# Patient Record
Sex: Female | Born: 2000 | Race: Black or African American | Hispanic: No | Marital: Single | Smoking: Never smoker
Health system: Southern US, Community
[De-identification: ages and names within clinical notes are randomized; demographics above are authoritative.]

---
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
## 2021-03-22 ENCOUNTER — Ambulatory Visit (HOSPITAL_COMMUNITY)
Admission: EM | Admit: 2021-03-22 | Discharge: 2021-03-22 | Disposition: A | Discharge status: Home / Self Care | Payer: BC Managed Care – PPO

## 2021-03-22 DIAGNOSIS — F331 Major depressive disorder, recurrent, moderate: Secondary | ICD-10-CM

## 2021-03-22 NOTE — Discharge Instructions (Addendum)
  Discharge recommendations:  Patient is to take medications as prescribed. Please see information for follow-up appointment with psychiatry and therapy. Please follow up with your primary care provider for all medical related needs.   Therapy: We recommend that patient participate in individual therapy to address mental health concerns.   Safety:  The patient should abstain from use of illicit substances/drugs and abuse of any medications. If symptoms worsen or do not continue to improve or if the patient becomes actively suicidal or homicidal then it is recommended that the patient return to the closest hospital emergency department, the [facility], or call 911 for further evaluation and treatment. National Suicide Prevention Lifeline 1-800-SUICIDE or 1-800-273-8255.  About 988 988 offers 24/7 access to trained crisis counselors who can help people experiencing mental health-related distress. People can call or text 988 or chat 988lifeline.org for themselves or if they are worried about a loved one who may need crisis support.  

## 2021-03-22 NOTE — Progress Notes (Signed)
°   03/22/21 1954  [facility] Triage Screening (Walk-ins at [facility] only)  How Did You Hear About [place]? Family/Friend  What Is the Reason for Your Visit/Call Today? Pt presented voluntarily accompanied by her mother. Pt stated that she has some suicdial ideation about 2 weeks ago without plan. She reported in the past she attempted to complete suicide with a sharp object when she was in the 9th/10th grade. She reported she has been  "dealing with mental illness" since was about 20 years old. Recently her stressors of "bad anxiety" and feeling overwhelmed in her sophomore year of college at [employer]. She reports feeling that she had ADHD due to her struggles at completing small tasks and falling behind on assignments. She also reports random moments of laughing or crying and she does not know why. Patient denies HI and AVH. Reports no alchol use, but reports that she smokes THC, one blunt or joint, every day. Pt has no current OP providers.  How Long Has This Been Causing You Problems? 1-6 months  Have You Recently Had Any Thoughts About Hurting Yourself? Yes  How long ago did you have thoughts about hurting yourself? Pt reports about "2 weeks ago"  Are You Planning to Commit Suicide/Harm Yourself At This time? No  Have you Recently Had Thoughts About Hurting Someone Karolee Ohs? No  Are You Planning To Harm Someone At This Time? No  Are you currently experiencing any auditory, visual or other hallucinations? No  Have You Used Any Alcohol or Drugs in the Past 24 Hours? Yes  How long ago did you use Drugs or Alcohol? Pt reports at some point today  What Did You Use and How Much? Pt reports use of THC everyday  Do you have any current medical co-morbidities that require immediate attention? No  Clinician description of patient physical appearance/behavior: Pt was casually dressed and adequately groomed. Pt was was polite and cooperative. Pt's speech, movement and thought content were within normal limits. Pt's mood was normal,  mood congruent with affect. Pt was oriented x 4.  What Do You Feel Would Help You the Most Today? Treatment for Depression or other mood problem;Stress Management  If access to [facility] was not available, would you have sought care in the Emergency Department? Yes  Determination of Need Urgent (48 hours)  Options For Referral Outpatient Therapy;Medication Management

## 2021-03-22 NOTE — ED Provider Notes (Signed)
Behavioral Health Urgent Care Medical Screening Exam  Patient Name: Brenda Cummings MRN: [id number] Date of Evaluation: 03/22/21 Chief Complaint:   Diagnosis:  Final diagnoses:  MDD (major depressive disorder), recurrent episode, moderate (HCC)    History of Present illness: Brenda Cummings is a 20 y.o. female with a history of depression who presents to [facility] voluntarily due to worsening depression. Patient endorses depressive symptoms, including low mood, insomnia, problems with energy, problems with concentration.   Patient denies loss of interest in pleasurable activities, tearfulness, and appetite changes. Patient denies suicidal and homicidal ideation. She reports that she had suicidal ideations approximately 2-3 weeks ago without plan/intent. 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 She reports difficult keeping up with assignments. Patient states that she lives in student housing. She requests a letter for an emotional support animal. Informed that we would not be able to provide a letter for an ESA. Reports daily use of marijuana. Denies use of alcohol and other substances.   Reviewed TTS assessment and validated with patient. On evaluation patient is alert and oriented x 4, pleasant, and cooperative. Speech is clear and coherent. Mood is depressed and affect is congruent with mood. Thought process is coherent and thought content is logical. Denies auditory and visual hallucinations. No indication that patient is responding to internal stimuli. No evidence of delusional thought content. Denies suicidal ideations. Denies homicidal ideations. Reports that she feels safe returning home.   Psychiatric Specialty Exam  Presentation  General Appearance:Appropriate for Environment; Well Groomed  Eye Contact:Good  Speech:Clear and Coherent; Normal Rate  Speech Volume:Normal  Handedness:No data recorded  Mood and Affect   Mood:Depressed; Anxious  Affect:Congruent   Thought Process  Thought Processes:Coherent; Goal Directed; Linear  Descriptions of Associations:Intact  Orientation:Full (Time, Place and Person)  Thought Content:WDL    Hallucinations:None  Ideas of Reference:None  Suicidal Thoughts:No  Homicidal Thoughts:No   Sensorium  Memory:Immediate Good; Recent Good; Remote Good  Judgment:Fair  Insight:Fair   Executive Functions  Concentration:Good  Attention Span:Good  Recall:Good  Fund of Knowledge:Good  Language:Good   Psychomotor Activity  Psychomotor Activity:Normal   Assets  Assets:Communication Skills; Desire for Improvement; Financial Resources/Insurance; Housing; Physical Health   Sleep  Sleep:Fair  Number of hours: No data recorded  Nutritional Assessment (For OBS and FBC admissions only) Has the patient had a weight loss or gain of 10 pounds or more in the last 3 months?: No Has the patient had a decrease in food intake/or appetite?: No Does the patient have dental problems?: No Does the patient have eating habits or behaviors that may be indicators of an eating disorder including binging or inducing vomiting?: No Has the patient recently lost weight without trying?: 0 Has the patient been eating poorly because of a decreased appetite?: 0 Malnutrition Screening Tool Score: 0   Physical Exam: Physical Exam Constitutional:      General: She is not in acute distress.    Appearance: She is not ill-appearing, toxic-appearing or diaphoretic.  HENT:     Head: Normocephalic.     Right Ear: External ear normal.     Left Ear: External ear normal.  Eyes:     Conjunctiva/sclera: Conjunctivae normal.     Pupils: Pupils are equal, round, and reactive to light.  Cardiovascular:     Rate and Rhythm: Normal rate.  Pulmonary:     Effort: Pulmonary effort is normal. No respiratory distress.  Musculoskeletal:  General: Normal range of motion.  Skin:     General: Skin is warm and dry.  Neurological:     Mental Status: She is alert and oriented to person, place, and time.  Psychiatric:        Mood and Affect: Mood is anxious and depressed.        Thought Content: Thought content is not paranoid or delusional. Thought content does not include homicidal or suicidal ideation.   Review of Systems  Constitutional:  Negative for chills, diaphoresis, fever, malaise/fatigue and weight loss.  HENT:  Negative for congestion.   Respiratory:  Negative for cough and shortness of breath.   Cardiovascular:  Negative for chest pain and palpitations.  Gastrointestinal:  Negative for diarrhea, nausea and vomiting.  Neurological:  Negative for dizziness and seizures.  Psychiatric/Behavioral:  Positive for depression. Negative for hallucinations, memory loss and suicidal ideas. The patient is nervous/anxious and has insomnia.   All other systems reviewed and are negative.  Blood pressure (!) 89/72, pulse 78, temperature 98.4 F (36.9 C), temperature source Oral, resp. rate 16, SpO2 100 %. There is no height or weight on file to calculate BMI.  Musculoskeletal: Strength & Muscle Tone: within normal limits Gait & Station: normal Patient leans: N/A   [facility] MSE Discharge Disposition for Follow up and Recommendations: Based on my evaluation the patient does not appear to have an emergency medical condition and can be discharged with resources and follow up care in outpatient services for Medication Management and Individual Therapy   Discussed services provided at the [facility]. Patient reports that she has a therapist at [facility] and will call to schedule an appointment.    Jackelyn Poling, NP 03/22/2021, 10:06 PM

## 2021-04-01 ENCOUNTER — Telehealth (HOSPITAL_COMMUNITY): Payer: Self-pay | Admitting: Family Medicine

## 2021-04-01 NOTE — BH Assessment (Signed)
Care Management - Follow Up [facility] Discharges   Writer made contact with the patient. Patient reports that he has a follow up appointment with her established provider at the [facility].

## 2022-07-24 ENCOUNTER — Encounter: Payer: Self-pay | Admitting: *Deleted

## 2022-11-09 ENCOUNTER — Other Ambulatory Visit: Payer: Self-pay

## 2022-11-09 MED ORDER — MEDROXYPROGESTERONE ACETATE 150 MG/ML IM SUSY
150.0000 mg | PREFILLED_SYRINGE | INTRAMUSCULAR | 1 refills | Status: AC
  Filled 2022-11-09: qty 1, 90d supply, fill #0

## 2023-09-26 ENCOUNTER — Ambulatory Visit (INDEPENDENT_AMBULATORY_CARE_PROVIDER_SITE_OTHER): Admitting: Adult Health

## 2023-10-24 ENCOUNTER — Ambulatory Visit (INDEPENDENT_AMBULATORY_CARE_PROVIDER_SITE_OTHER): Admitting: Adult Health

## 2023-10-24 ENCOUNTER — Encounter: Payer: Self-pay | Admitting: Adult Health

## 2023-10-24 VITALS — BP 108/70 | HR 92 | Ht 65.0 in | Wt 128.6 lb

## 2023-10-24 DIAGNOSIS — F1220 Cannabis dependence, uncomplicated: Secondary | ICD-10-CM | POA: Diagnosis not present

## 2023-10-24 DIAGNOSIS — F0630 Mood disorder due to known physiological condition, unspecified: Secondary | ICD-10-CM | POA: Diagnosis not present

## 2023-10-24 NOTE — Progress Notes (Signed)
 [facility] MD/PA/NP Initial Note  10/24/2023 9:48 AM Brenda Cummings  MRN:  [id number]  Chief Complaint:   HPI:   Patient seen today for initial psychiatric evaluation.   Accompanied by mother.  Therapist referral.  Patient and mother providing background collateral.  Describes mood today as not good. Pleasant. Flat. Mood symptoms - denies depression - but feels her chest is heavy all the time. Reports lower interest and motivation. Reports anxiety - every day. Has techniques she uses to help ease her anxiety - talking to mom - listening to music. Reports irritability every day - can get annoyed and triggered. Reports panic attacks - here and there. Reports worry, rumination and over thinking. Reports some obsessive thoughts and acts - I'm obsessed with myself. Reports mood fluctuations. Reports using THC 3 or more times a day - every day. Stating I feel like I'm doing fine right now. She has been working with a therapist and was referred for mood instability. She completed a MDQ which would indicate a mood disorder.  Energy levels always high. Active, does not have a regular exercise routine - walking dog. 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  Enjoys some usual interests and activities. Lives alone with dog. Family local. Spending time with family. Appetite decreased. Weight stable. Sleeps better some nights than others. Averages 5 to 6 hours - it's variable - sleeping in the lat afternoon. Focus and concentration difficulties. Completing tasks. Managing aspects of household. 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 Denies SI or HI.  Denies AH or VH. Denies self harm - history of cutting. Reports getting excessive tattoos.  Reports substance use - smoking THC 3 times a day. Has used THC since age 23.  MDQ Mood questionnaire completed and patient identified the following:  - Patient indicated feeling so good or so hyper that other people thought they were not your  normal self, or you were so hyper that you got into trouble - you were so irritable that you shouted at people or started fights or arguments - you felt much more self-confident than usual - you got much less sleep than usual and found you didn't really miss it - you were much more talkative or spoke faster than usual - thoughts raced through your head or you couldn't slow your mind down,  - -  you were so easily distracted by things around you that you had trouble concentrating or staying on track - you had much more energy than usual - you were much more active or did many more things than usual -  you were much more social or outgoing than usual; for example, you - telephoned friends in the middle of the night, spending money got you or your family into trouble?  Previous medication trials:  Hydroxyzine   Visit Diagnosis: No diagnosis found.  Past Psychiatric History: Denies any family history of mental illness.   Past Medical History: No past medical history on file. No past surgical history on file.  Family Psychiatric History: Denies psychiatric hospitalization.   Family History:  Family History  Problem Relation Age of Onset   Sudden death Neg Hx    Heart attack Neg Hx     Social History:  Social History   Socioeconomic History   Marital status: Single    Spouse name: Not on file   Number of children: Not on file   Years of education: Not on file   Highest education level: Not on file  Occupational History   Not on file  Tobacco Use   Smoking status: Never   Smokeless tobacco: Never  Vaping Use   Vaping status: Never Used  Substance and Sexual Activity   Alcohol use: Never   Drug use: Never   Sexual activity: Not on file  Other Topics Concern   Not on file  Social History Narrative   Not on file   Social Drivers of Health   Financial Resource Strain: Not on file  Food Insecurity: Not on file  Transportation Needs: Not on file  Physical Activity: Not on  file  Stress: Not on file  Social Connections: Not on file    Allergies:  Allergies  Allergen Reactions   No Known Allergies     Metabolic Disorder Labs: No results found for: HGBA1C, MPG No results found for: PROLACTIN No results found for: CHOL, TRIG, HDL, CHOLHDL, VLDL, LDLCALC No results found for: TSH  Therapeutic Level Labs: No results found for: LITHIUM No results found for: VALPROATE No results found for: CBMZ  Current Medications: Current Outpatient Medications  Medication Sig Dispense Refill   cefdinir (OMNICEF) 300 MG capsule Take 300 mg by mouth every 12 (twelve) hours.     ferrous sulfate 325 (65 FE) MG tablet Take 325 mg by mouth daily with breakfast.     hydrOXYzine  (ATARAX /VISTARIL ) 25 MG tablet Take 1-2 tablets (25-50 mg total) by mouth every 6 (six) hours as needed. 20 tablet 0   medroxyPROGESTERone  Acetate (DEPO-PROVERA ) 150 MG/ML SUSY Inject 1 mL (150 mg total) into the muscle every 3 (three) months. 1 mL 1   predniSONE  (STERAPRED UNI-PAK 21 TAB) 10 MG (21) TBPK tablet Take as directed 21 tablet 0   TRI-LO-MARZIA 0.18/0.215/0.25 MG-25 MCG tab Take 1 tablet by mouth daily.     Vitamin D, Ergocalciferol, (DRISDOL) 1.25 MG (50000 UNIT) CAPS capsule Take 50,000 Units by mouth every 7 (seven) days.     No current facility-administered medications for this visit.    Medication Side Effects: none  Orders placed this visit:  No orders of the defined types were placed in this encounter.   Psychiatric Specialty Exam:  Review of Systems  There were no vitals taken for this visit.There is no height or weight on file to calculate BMI.  General Appearance: Casual and Neat  Eye Contact:  Good  Speech:  Clear and Coherent and Normal Rate  Volume:  Normal  Mood:  Anxious and Irritable  Affect:  Appropriate and Congruent  Thought Process:  Coherent and Descriptions of Associations: Intact  Orientation:  Full (Time, Place, and Person)   Thought Content: Logical   Suicidal Thoughts:  No  Homicidal Thoughts:  No  Memory:  WNL  Judgement:  Good  Insight:  Good  Psychomotor Activity:  Normal  Concentration:  Concentration: Good and Attention Span: Good  Recall:  Good  Fund of Knowledge: Good  Language: Good  Assets:  Communication Skills Desire for Improvement Financial Resources/Insurance Housing Intimacy Leisure Time Physical Health Resilience Social Support Talents/Skills Transportation Vocational/Educational  ADL's:  Intact  Cognition: WNL  Prognosis:  Good   Screenings:  Flowsheet Row ED from 08/02/2020 in [facility] at [facility]  C-SSRS RISK CATEGORY No Risk    Receiving Psychotherapy: No   Treatment Plan/Recommendations:   Plan:  PDMP reviewed  Discussed medications as a treatment option. However, her mood symptoms may be from daily THC use - 3 times daily. Reports she has been using for  some time. She is willing to discontinue use over the next month as it may be contributing to her current mood instability. Will have patient return in 4 weeks to discuss further options.  RTC 4 weeks  Patient advised to contact office with any questions, adverse effects, or acute worsening in signs and symptoms.      Khaliya Golinski N Carmyn Hamm, NP

## 2023-11-19 ENCOUNTER — Telehealth: Admitting: Adult Health

## 2023-11-19 ENCOUNTER — Encounter: Payer: Self-pay | Admitting: Adult Health

## 2023-11-19 DIAGNOSIS — F0630 Mood disorder due to known physiological condition, unspecified: Secondary | ICD-10-CM

## 2023-11-19 DIAGNOSIS — F1220 Cannabis dependence, uncomplicated: Secondary | ICD-10-CM

## 2023-11-19 NOTE — Progress Notes (Signed)
 Brenda Cummings [id number] 11-May-2000 23 y.o.  Virtual Visit via Video Note  I connected with pt @ on 11/19/23 at  9:30 AM EDT by a video enabled telemedicine application and verified that I am speaking with the correct person using two identifiers.   I discussed the limitations of evaluation and management by telemedicine and the availability of in person appointments. The patient expressed understanding and agreed to proceed.  I discussed the assessment and treatment plan with the patient. The patient was provided an opportunity to ask questions and all were answered. The patient agreed with the plan and demonstrated an understanding of the instructions.   The patient was advised to call back or seek an in-person evaluation if the symptoms worsen or if the condition fails to improve as anticipated.  I provided 20 minutes of non-face-to-face time during this encounter.  The patient was located at home.  The provider was located at [facility].   Angeline LOISE Sayers, NP   Subjective:   Patient ID:  Brenda Cummings is a 23 y.o. (DOB April 27, 2000) female.  Chief Complaint: No chief complaint on file.   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  Describes mood today as not good. Pleasant. Flat. Mood symptoms - denies depression - nothing prolonged. Reports varying interest and motivation. Reports anxiety - not as heightened as it was. Denies panic attacks. Reports worry, rumination and over thinking. Reports some obsessive thoughts and acts. Reports mood fluctuations. Reports using THC 3 or more times a day - every day. Stating I feel like I'm doing fine - taking it day by day. She has been working with a therapist and was referred for mood instability. She completed a MDQ which would indicate a mood disorder.  Energy levels fluctuates. She does not have a regular exercise routine - walking dog. 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  Enjoys some  usual interests and activities. Lives alone with dog. Family local. Spending time with family. Appetite improved. Weight stable. Sleeps better some nights than others. Averages 7 to 8 hours Focus and concentration difficulties. Completing tasks. Managing aspects of household. 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 Denies SI or HI.  Denies AH or VH. Denies self harm - history of cutting.  Reports substance use - smoking THC 1 to 2 times a day. Has used THC since age 39.  Previous medication trials:  Hydroxyzine  Review of Systems:  Review of Systems  Musculoskeletal:  Negative for gait problem.  Neurological:  Negative for tremors.  Psychiatric/Behavioral:         Please refer to HPI    Medications: I have reviewed the patient's current medications.  Current Outpatient Medications  Medication Sig Dispense Refill   cefdinir (OMNICEF) 300 MG capsule Take 300 mg by mouth every 12 (twelve) hours.     ferrous sulfate 325 (65 FE) MG tablet Take 325 mg by mouth daily with breakfast.     hydrOXYzine (ATARAX/VISTARIL) 25 MG tablet Take 1-2 tablets (25-50 mg total) by mouth every 6 (six) hours as needed. 20 tablet 0   medroxyPROGESTERone Acetate (DEPO-PROVERA) 150 MG/ML SUSY Inject 1 mL (150 mg total) into the muscle every 3 (three) months. 1 mL 1   predniSONE (STERAPRED UNI-PAK 21 TAB) 10 MG (21) TBPK tablet Take as directed 21 tablet 0   TRI-LO-MARZIA 0.18/0.215/0.25 MG-25 MCG tab Take 1 tablet by mouth daily.     Vitamin D, Ergocalciferol, (  DRISDOL) 1.25 MG (50000 UNIT) CAPS capsule Take 50,000 Units by mouth every 7 (seven) days.     No current facility-administered medications for this visit.    Medication Side Effects: None  Allergies:  Allergies  Allergen Reactions   No Known Allergies     No past medical history on file.  Family History  Problem Relation Age of Onset   Sudden death Neg Hx    Heart attack Neg Hx     Social History   Socioeconomic History    Marital status: Single    Spouse name: Not on file   Number of children: Not on file   Years of education: Not on file   Highest education level: Not on file  Occupational History   Not on file  Tobacco Use   Smoking status: Never   Smokeless tobacco: Never  Vaping Use   Vaping status: Never Used  Substance and Sexual Activity   Alcohol use: Never   Drug use: Never   Sexual activity: Not on file  Other Topics Concern   Not on file  Social History Narrative   Not on file   Social Drivers of Health   Financial Resource Strain: Not on file  Food Insecurity: Not on file  Transportation Needs: Not on file  Physical Activity: Not on file  Stress: Not on file  Social Connections: Not on file  Intimate Partner Violence: Not on file    Past Medical History, Surgical history, Social history, and Family history were reviewed and updated as appropriate.   Please see review of systems for further details on the patient's review from today.   Objective:   Physical Exam:  There were no vitals taken for this visit.  Physical Exam Constitutional:      General: She is not in acute distress. Musculoskeletal:        General: No deformity.  Neurological:     Mental Status: She is alert and oriented to person, place, and time.     Coordination: Coordination normal.  Psychiatric:        Attention and Perception: Attention and perception normal. She does not perceive auditory or visual hallucinations.        Mood and Affect: Mood normal. Mood is not anxious or depressed. Affect is not labile, blunt, angry or inappropriate.        Speech: Speech normal.        Behavior: Behavior normal.        Thought Content: Thought content normal. Thought content is not paranoid or delusional. Thought content does not include homicidal or suicidal ideation. Thought content does not include homicidal or suicidal plan.        Cognition and Memory: Cognition and memory normal.        Judgment:  Judgment normal.     Comments: Insight intact     Lab Review:     Component Value Date/Time   NA 135 06/10/2019 2337   K 3.9 06/10/2019 2337   CL 105 06/10/2019 2337   CO2 23 06/10/2019 2337   GLUCOSE 113 (H) 06/10/2019 2337   BUN 12 06/10/2019 2337   CREATININE 0.95 06/10/2019 2337   CALCIUM 8.5 (L) 06/10/2019 2337   PROT 7.3 06/10/2019 2337   ALBUMIN 3.6 06/10/2019 2337   AST 14 (L) 06/10/2019 2337   ALT 11 06/10/2019 2337   ALKPHOS 55 06/10/2019 2337   BILITOT 0.6 06/10/2019 2337   GFRNONAA NOT CALCULATED 06/10/2019 2337   GFRAA NOT CALCULATED  06/10/2019 2337       Component Value Date/Time   WBC 5.0 06/10/2019 2337   RBC 4.56 06/10/2019 2337   HGB 13.6 06/10/2019 2337   HCT 39.0 06/10/2019 2337   PLT 333 06/10/2019 2337   MCV 85.5 06/10/2019 2337   MCH 29.8 06/10/2019 2337   MCHC 34.9 06/10/2019 2337   RDW 13.9 06/10/2019 2337   LYMPHSABS 2.0 06/10/2019 2337   MONOABS 0.2 06/10/2019 2337   EOSABS 0.0 06/10/2019 2337   BASOSABS 0.0 06/10/2019 2337    No results found for: POCLITH, LITHIUM   No results found for: PHENYTOIN, PHENOBARB, VALPROATE, CBMZ   .res Assessment: Plan:    Treatment Plan/Recommendations:   Plan:  PDMP reviewed  Discussed medications as a treatment option. However, patient would like to be tested for ADD. She has reduced her THC use from three times to twice daily.  20 minutes spent dedicated to the care of this patient on the date of this encounter to include pre-visit review of records, ordering of medication, post visit documentation, and face-to-face time with the patient discussing Mood Disorder and THC abuse. Discussed continuing current medication regimen.  RTC 4 weeks or after ADD testing completed.  Patient advised to contact office with any questions, adverse effects, or acute worsening in signs and symptoms.  There are no diagnoses linked to this encounter.   Please see After Visit Summary for patient  specific instructions.  Future Appointments  Date Time Provider Department Center  11/19/2023  9:30 AM Gwendolynn Merkey Nattalie, NP [facility] None    No orders of the defined types were placed in this encounter.     -------------------------------
